# Patient Record
Sex: Male | Born: 1975 | Hispanic: Yes | Marital: Single | Smoking: Never smoker
Health system: Southern US, Community
[De-identification: ages and names within clinical notes are randomized; demographics above are authoritative.]

---

## 2020-05-22 ENCOUNTER — Inpatient Hospital Stay (HOSPITAL_COMMUNITY)
Admission: EM | Admit: 2020-05-22 | Discharge: 2020-06-02 | DRG: 988 | Disposition: A | Discharge status: Home / Self Care | Payer: Self-pay | Attending: General Surgery | Admitting: General Surgery

## 2020-05-22 ENCOUNTER — Emergency Department (HOSPITAL_COMMUNITY): Payer: Self-pay

## 2020-05-22 DIAGNOSIS — Y908 Blood alcohol level of 240 mg/100 ml or more: Secondary | ICD-10-CM | POA: Diagnosis present

## 2020-05-22 DIAGNOSIS — S50311A Abrasion of right elbow, initial encounter: Secondary | ICD-10-CM | POA: Diagnosis present

## 2020-05-22 DIAGNOSIS — S7012XA Contusion of left thigh, initial encounter: Secondary | ICD-10-CM | POA: Diagnosis present

## 2020-05-22 DIAGNOSIS — Z20822 Contact with and (suspected) exposure to covid-19: Secondary | ICD-10-CM | POA: Diagnosis present

## 2020-05-22 DIAGNOSIS — T1490XA Injury, unspecified, initial encounter: Secondary | ICD-10-CM

## 2020-05-22 DIAGNOSIS — S0181XA Laceration without foreign body of other part of head, initial encounter: Secondary | ICD-10-CM | POA: Diagnosis present

## 2020-05-22 DIAGNOSIS — F10220 Alcohol dependence with intoxication, uncomplicated: Secondary | ICD-10-CM | POA: Diagnosis present

## 2020-05-22 DIAGNOSIS — S0001XA Abrasion of scalp, initial encounter: Secondary | ICD-10-CM | POA: Diagnosis present

## 2020-05-22 DIAGNOSIS — S40211A Abrasion of right shoulder, initial encounter: Secondary | ICD-10-CM | POA: Diagnosis present

## 2020-05-22 DIAGNOSIS — Y92410 Unspecified street and highway as the place of occurrence of the external cause: Secondary | ICD-10-CM

## 2020-05-22 DIAGNOSIS — S72432A Displaced fracture of medial condyle of left femur, initial encounter for closed fracture: Principal | ICD-10-CM | POA: Diagnosis present

## 2020-05-22 DIAGNOSIS — Z23 Encounter for immunization: Secondary | ICD-10-CM

## 2020-05-22 DIAGNOSIS — S32029A Unspecified fracture of second lumbar vertebra, initial encounter for closed fracture: Secondary | ICD-10-CM | POA: Diagnosis present

## 2020-05-22 DIAGNOSIS — S81812A Laceration without foreign body, left lower leg, initial encounter: Secondary | ICD-10-CM | POA: Diagnosis present

## 2020-05-22 DIAGNOSIS — S32592A Other specified fracture of left pubis, initial encounter for closed fracture: Secondary | ICD-10-CM | POA: Diagnosis present

## 2020-05-22 DIAGNOSIS — F10230 Alcohol dependence with withdrawal, uncomplicated: Secondary | ICD-10-CM | POA: Diagnosis not present

## 2020-05-22 LAB — LACTIC ACID, PLASMA: Lactic Acid, Venous: 3.6 mmol/L (ref 0.5–1.9)

## 2020-05-22 LAB — CBC
HCT: 40.3 % (ref 39.0–52.0)
Hemoglobin: 13.1 g/dL (ref 13.0–17.0)
MCH: 33.1 pg (ref 26.0–34.0)
MCHC: 32.5 g/dL (ref 30.0–36.0)
MCV: 101.8 fL — ABNORMAL HIGH (ref 80.0–100.0)
Platelets: 186 10*3/uL (ref 150–400)
RBC: 3.96 MIL/uL — ABNORMAL LOW (ref 4.22–5.81)
RDW: 12.5 % (ref 11.5–15.5)
WBC: 6.7 10*3/uL (ref 4.0–10.5)
nRBC: 0 % (ref 0.0–0.2)

## 2020-05-22 LAB — PROTIME-INR
INR: 0.9 (ref 0.8–1.2)
Prothrombin Time: 12.2 seconds (ref 11.4–15.2)

## 2020-05-22 LAB — ETHANOL: Alcohol, Ethyl (B): 371 mg/dL (ref <10)

## 2020-05-22 LAB — SAMPLE TO BLOOD BANK

## 2020-05-22 LAB — SARS CORONAVIRUS 2 BY RT PCR (HOSPITAL ORDER, PERFORMED IN ~~LOC~~ HOSPITAL LAB): SARS Coronavirus 2: NEGATIVE

## 2020-05-22 MED ORDER — SODIUM CHLORIDE 0.9 % IV SOLN
INTRAVENOUS | Status: AC | PRN
  Administered 2020-05-22: 1000 mL via INTRAVENOUS

## 2020-05-22 MED ORDER — IOHEXOL 300 MG/ML  SOLN
100.0000 mL | Freq: Once | INTRAMUSCULAR | Status: AC | PRN
  Administered 2020-05-22: 100 mL via INTRAVENOUS

## 2020-05-22 MED ORDER — TETANUS-DIPHTH-ACELL PERTUSSIS 5-2.5-18.5 LF-MCG/0.5 IM SUSP
0.5000 mL | Freq: Once | INTRAMUSCULAR | Status: AC
  Administered 2020-05-23: 0.5 mL via INTRAMUSCULAR
  Filled 2020-05-22: qty 0.5
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
## 2020-05-22 NOTE — ED Provider Notes (Addendum)
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 back and occiput.  Imaging most notable for left superior and inferior pubic rami fractures as well as right L2 transverse process fracture.  XR of the R knee notable for possible femur fx and rads recommended CT to further eval.  Trauma surgery consulted for admission.  Pt is currently awaiting admission to the trauma service and results of CT femur at time of signout.  Pt signed out to oncoming provider at 0100 in stable condition. Final Clinical Impression(s) / ED Diagnoses Final diagnoses:  Trauma    Rx / DC Orders ED Discharge Orders    None       Rickey PrimusWeekley, Deyonte Cadden, MD 05/23/20 1315    Rickey PrimusWeekley, Kaleya Douse, MD 05/23/20 1317    Little, Ambrose Finlandachel Morgan, MD 05/24/20 [phone]

## 2020-05-23 ENCOUNTER — Emergency Department (HOSPITAL_COMMUNITY): Payer: Self-pay

## 2020-05-23 ENCOUNTER — Observation Stay (HOSPITAL_COMMUNITY): Payer: Self-pay

## 2020-05-23 LAB — CBC
HCT: 30.8 % — ABNORMAL LOW (ref 39.0–52.0)
HCT: 29.9 % — ABNORMAL LOW (ref 39.0–52.0)
Hemoglobin: 10.2 g/dL — ABNORMAL LOW (ref 13.0–17.0)
Hemoglobin: 10.1 g/dL — ABNORMAL LOW (ref 13.0–17.0)
MCH: 34 pg (ref 26.0–34.0)
MCH: 34.2 pg — ABNORMAL HIGH (ref 26.0–34.0)
MCHC: 33.1 g/dL (ref 30.0–36.0)
MCHC: 33.8 g/dL (ref 30.0–36.0)
MCV: 102.7 fL — ABNORMAL HIGH (ref 80.0–100.0)
MCV: 101.4 fL — ABNORMAL HIGH (ref 80.0–100.0)
Platelets: 136 10*3/uL — ABNORMAL LOW (ref 150–400)
Platelets: 145 10*3/uL — ABNORMAL LOW (ref 150–400)
RBC: 3 MIL/uL — ABNORMAL LOW (ref 4.22–5.81)
RBC: 2.95 MIL/uL — ABNORMAL LOW (ref 4.22–5.81)
RDW: 12.6 % (ref 11.5–15.5)
RDW: 12.6 % (ref 11.5–15.5)
WBC: 7.6 10*3/uL (ref 4.0–10.5)
WBC: 7.3 10*3/uL (ref 4.0–10.5)
nRBC: 0 % (ref 0.0–0.2)
nRBC: 0 % (ref 0.0–0.2)

## 2020-05-23 LAB — PHOSPHORUS: Phosphorus: 2.8 mg/dL (ref 2.5–4.6)

## 2020-05-23 LAB — COMPREHENSIVE METABOLIC PANEL
ALT: 83 U/L — ABNORMAL HIGH (ref 0–44)
ALT: 68 U/L — ABNORMAL HIGH (ref 0–44)
AST: 211 U/L — ABNORMAL HIGH (ref 15–41)
AST: 182 U/L — ABNORMAL HIGH (ref 15–41)
Albumin: 4 g/dL (ref 3.5–5.0)
Albumin: 3.4 g/dL — ABNORMAL LOW (ref 3.5–5.0)
Alkaline Phosphatase: 61 U/L (ref 38–126)
Alkaline Phosphatase: 50 U/L (ref 38–126)
Anion gap: 14 (ref 5–15)
Anion gap: 16 — ABNORMAL HIGH (ref 5–15)
BUN: 6 mg/dL (ref 6–20)
BUN: 6 mg/dL (ref 6–20)
CO2: 21 mmol/L — ABNORMAL LOW (ref 22–32)
CO2: 21 mmol/L — ABNORMAL LOW (ref 22–32)
Calcium: 8.3 mg/dL — ABNORMAL LOW (ref 8.9–10.3)
Calcium: 7.8 mg/dL — ABNORMAL LOW (ref 8.9–10.3)
Chloride: 101 mmol/L (ref 98–111)
Chloride: 101 mmol/L (ref 98–111)
Creatinine, Ser: 0.75 mg/dL (ref 0.61–1.24)
Creatinine, Ser: 0.53 mg/dL — ABNORMAL LOW (ref 0.61–1.24)
GFR calc Af Amer: >60 mL/min (ref >60)
GFR calc Af Amer: >60 mL/min (ref >60)
GFR calc non Af Amer: 55 mL/min — ABNORMAL LOW (ref >60)
GFR calc non Af Amer: >60 mL/min (ref >60)
Glucose, Bld: 123 mg/dL — ABNORMAL HIGH (ref 70–99)
Glucose, Bld: 145 mg/dL — ABNORMAL HIGH (ref 70–99)
Potassium: 3.3 mmol/L — ABNORMAL LOW (ref 3.5–5.1)
Potassium: 3.5 mmol/L (ref 3.5–5.1)
Sodium: 136 mmol/L (ref 135–145)
Sodium: 138 mmol/L (ref 135–145)
Total Bilirubin: 0.4 mg/dL (ref 0.3–1.2)
Total Bilirubin: 1.2 mg/dL (ref 0.3–1.2)
Total Protein: 7.1 g/dL (ref 6.5–8.1)
Total Protein: 6.4 g/dL — ABNORMAL LOW (ref 6.5–8.1)

## 2020-05-23 LAB — URINALYSIS, ROUTINE W REFLEX MICROSCOPIC
Bacteria, UA: NONE SEEN
Bilirubin Urine: NEGATIVE
Glucose, UA: NEGATIVE mg/dL
Ketones, ur: 5 mg/dL — AB
Leukocytes,Ua: NEGATIVE
Nitrite: NEGATIVE
Protein, ur: NEGATIVE mg/dL
Specific Gravity, Urine: 1.018 (ref 1.005–1.030)
pH: 5 (ref 5.0–8.0)

## 2020-05-23 LAB — BASIC METABOLIC PANEL
Anion gap: 12 (ref 5–15)
BUN: 6 mg/dL (ref 6–20)
CO2: 20 mmol/L — ABNORMAL LOW (ref 22–32)
Calcium: 7.3 mg/dL — ABNORMAL LOW (ref 8.9–10.3)
Chloride: 107 mmol/L (ref 98–111)
Creatinine, Ser: 0.57 mg/dL — ABNORMAL LOW (ref 0.61–1.24)
GFR calc Af Amer: >60 mL/min (ref >60)
GFR calc non Af Amer: >60 mL/min (ref >60)
Glucose, Bld: 95 mg/dL (ref 70–99)
Potassium: 3.4 mmol/L — ABNORMAL LOW (ref 3.5–5.1)
Sodium: 139 mmol/L (ref 135–145)

## 2020-05-23 LAB — I-STAT CHEM 8, ED
BUN: 5 mg/dL — ABNORMAL LOW (ref 6–20)
Calcium, Ion: 0.94 mmol/L — ABNORMAL LOW (ref 1.15–1.40)
Chloride: 100 mmol/L (ref 98–111)
Creatinine, Ser: 1.1 mg/dL (ref 0.61–1.24)
Glucose, Bld: 117 mg/dL — ABNORMAL HIGH (ref 70–99)
HCT: 42 % (ref 39.0–52.0)
Hemoglobin: 14.3 g/dL (ref 13.0–17.0)
Potassium: 3.3 mmol/L — ABNORMAL LOW (ref 3.5–5.1)
Sodium: 138 mmol/L (ref 135–145)
TCO2: 21 mmol/L — ABNORMAL LOW (ref 22–32)

## 2020-05-23 LAB — MAGNESIUM: Magnesium: 1.6 mg/dL — ABNORMAL LOW (ref 1.7–2.4)

## 2020-05-23 MED ORDER — OXYCODONE HCL 5 MG PO TABS
10.0000 mg | ORAL_TABLET | ORAL | Status: DC | PRN
  Administered 2020-05-23 – 2020-06-02 (×13): 10 mg via ORAL
  Filled 2020-05-23 (×13): qty 2

## 2020-05-23 MED ORDER — THIAMINE HCL 100 MG/ML IJ SOLN: 100.0000 mg | Freq: Every day | INTRAMUSCULAR | Status: DC

## 2020-05-23 MED ORDER — LIDOCAINE-EPINEPHRINE 1 %-1:100000 IJ SOLN
INTRAMUSCULAR | Status: AC
  Filled 2020-05-23: qty 1

## 2020-05-23 MED ORDER — LIDOCAINE-EPINEPHRINE-TETRACAINE (LET) TOPICAL GEL
3.0000 mL | Freq: Once | TOPICAL | Status: AC
  Administered 2020-05-23: 3 mL via TOPICAL
  Filled 2020-05-23: qty 3

## 2020-05-23 MED ORDER — THIAMINE HCL 100 MG PO TABS
100.0000 mg | ORAL_TABLET | Freq: Every day | ORAL | Status: DC
  Administered 2020-05-23 – 2020-05-24 (×2): 100 mg via ORAL
  Filled 2020-05-23 (×2): qty 1

## 2020-05-23 MED ORDER — FOLIC ACID 1 MG PO TABS
1.0000 mg | ORAL_TABLET | Freq: Every day | ORAL | Status: DC
  Administered 2020-05-23 – 2020-05-24 (×2): 1 mg via ORAL
  Filled 2020-05-23 (×2): qty 1

## 2020-05-23 MED ORDER — MORPHINE SULFATE (PF) 2 MG/ML IV SOLN
2.0000 mg | INTRAVENOUS | Status: DC | PRN
  Administered 2020-05-24 – 2020-05-29 (×5): 2 mg via INTRAVENOUS
  Filled 2020-05-23 (×6): qty 1

## 2020-05-23 MED ORDER — ADULT MULTIVITAMIN W/MINERALS CH
1.0000 | ORAL_TABLET | Freq: Every day | ORAL | Status: DC
  Administered 2020-05-23 – 2020-05-24 (×2): 1 via ORAL
  Filled 2020-05-23 (×2): qty 1

## 2020-05-23 MED ORDER — ONDANSETRON HCL 4 MG/2ML IJ SOLN: 4.0000 mg | Freq: Four times a day (QID) | INTRAMUSCULAR | Status: DC | PRN

## 2020-05-23 MED ORDER — ONDANSETRON 4 MG PO TBDP: 4.0000 mg | ORAL_TABLET | Freq: Four times a day (QID) | ORAL | Status: DC | PRN

## 2020-05-23 MED ORDER — DEXTROSE-NACL 5-0.9 % IV SOLN: INTRAVENOUS | Status: DC

## 2020-05-23 MED ORDER — LORAZEPAM 2 MG/ML IJ SOLN
0.0000 mg | Freq: Two times a day (BID) | INTRAMUSCULAR | Status: DC
  Filled 2020-05-23: qty 2

## 2020-05-23 MED ORDER — ENOXAPARIN SODIUM 40 MG/0.4ML ~~LOC~~ SOLN
40.0000 mg | Freq: Every day | SUBCUTANEOUS | Status: DC
  Administered 2020-05-23 – 2020-06-02 (×11): 40 mg via SUBCUTANEOUS
  Filled 2020-05-23 (×12): qty 0.4

## 2020-05-23 MED ORDER — LORAZEPAM 2 MG/ML IJ SOLN
1.0000 mg | INTRAMUSCULAR | Status: DC | PRN
  Administered 2020-05-24: 4 mg via INTRAVENOUS
  Administered 2020-05-24 (×3): 2 mg via INTRAVENOUS
  Filled 2020-05-23: qty 1
  Filled 2020-05-23: qty 2

## 2020-05-23 MED ORDER — LORAZEPAM 2 MG/ML IJ SOLN
0.0000 mg | Freq: Four times a day (QID) | INTRAMUSCULAR | Status: DC
  Administered 2020-05-23 – 2020-05-24 (×3): 1 mg via INTRAVENOUS
  Administered 2020-05-24: 4 mg via INTRAVENOUS
  Filled 2020-05-23 (×3): qty 1
  Filled 2020-05-23: qty 2

## 2020-05-23 MED ORDER — LORAZEPAM 1 MG PO TABS: 1.0000 mg | ORAL_TABLET | ORAL | Status: DC | PRN

## 2020-05-23 MED ORDER — LIDOCAINE HCL (PF) 1 % IJ SOLN
10.0000 mL | Freq: Once | INTRAMUSCULAR | Status: AC
  Administered 2020-05-23: 10 mL

## 2020-05-23 NOTE — ED Provider Notes (Signed)
..  Laceration Repair  Date/Time: 05/23/2020 3:00 AM Performed by: Cherly Anderson, PA-C Authorized by: Cherly Anderson, PA-C   Consent:    Consent obtained:  Verbal   Consent given by:  Patient   Risks discussed:  Infection, need for additional repair, nerve damage, poor wound healing, poor cosmetic result, pain, tendon damage, retained foreign body and vascular damage   Alternatives discussed:  No treatment Anesthesia (see MAR for exact dosages):    Anesthesia method:  Topical application and local infiltration   Topical anesthetic:  LET   Local anesthetic:  Lidocaine 1% w/o epi Laceration details:    Location:  Leg   Leg location:  L lower leg   Length (cm):  7   Depth (mm):  4 Repair type:    Repair type:  Intermediate Pre-procedure details:    Preparation:  Patient was prepped and draped in usual sterile fashion and imaging obtained to evaluate for foreign bodies Exploration:    Hemostasis achieved with:  Direct pressure   Wound exploration: wound explored through full range of motion and entire depth of wound probed and visualized     Contaminated: no   Treatment:    Area cleansed with:  Betadine   Amount of cleaning:  Standard   Irrigation solution:  Sterile water   Irrigation volume:  1L   Irrigation method:  Pressure wash Fascia repair:    Suture size:  4-0   Suture material:  Vicryl   Suture technique:  Simple interrupted   Number of sutures:  3 Skin repair:    Repair method:  Sutures   Suture size:  4-0   Suture material:  Nylon   Suture technique:  Simple interrupted   Number of sutures:  8 Approximation:    Approximation:  Close Post-procedure details:    Dressing:  Antibiotic ointment   Patient tolerance of procedure:  Tolerated well, no immediate complications  Please see Dr. Cora Daniels & Dr. Fredirick Maudlin documentation for full H&P. Laceration performed by me @ primary ED team's request per procedure note above.     Cherly Anderson,  PA-C 05/23/20 0302    Little, Ambrose Finland, MD 05/23/20 1014

## 2020-05-23 NOTE — ED Notes (Signed)
[facility] adviser at  The bedside

## 2020-05-23 NOTE — ED Notes (Signed)
The pt lupped of his c-collar one hour ago and he refused to put it back pn

## 2020-05-23 NOTE — ED Notes (Signed)
MD paged about CIWA of 5, no new orders received yet

## 2020-05-23 NOTE — Progress Notes (Signed)
Orthopedic Tech Progress Note Patient Details:  David Bishop 1975/10/29 [id number] Called in order to [facility]. Patient ID: David Bishop, male   DOB: 02/23/1976, 44 y.o.   MRN: [id number]   [name] 05/23/2020, 12:46 PM

## 2020-05-23 NOTE — ED Notes (Signed)
Pt transported to MRI 
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
## 2020-05-23 NOTE — Progress Notes (Signed)
Orthopedic Tech Progress Note Patient Details:  David Bishop August 27, 1976 [id number]  Ortho Devices Type of Ortho Device: Knee Immobilizer Ortho Device/Splint Location: lle Ortho Device/Splint Interventions: Ordered, Application, Adjustment   Post Interventions Patient Tolerated: Well Instructions Provided: Care of device, Adjustment of device   [name] 05/23/2020, 6:24 AM

## 2020-05-23 NOTE — ED Notes (Signed)
The pts bp has lowered since  The last bag of  fluiid infused  nss hung to run wo per dr Rubin Payor

## 2020-05-23 NOTE — ED Notes (Signed)
Pt back to c-r sleeping unless disturbed
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
## 2020-05-23 NOTE — ED Notes (Signed)
Sleeping still 
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
## 2020-05-24 LAB — MRSA PCR SCREENING: MRSA by PCR: NEGATIVE

## 2020-05-24 MED ORDER — LORAZEPAM 2 MG/ML IJ SOLN
1.0000 mg | INTRAMUSCULAR | Status: DC | PRN
  Administered 2020-05-24 – 2020-05-29 (×24): 2 mg via INTRAVENOUS
  Filled 2020-05-24 (×25): qty 1

## 2020-05-24 MED ORDER — CHLORDIAZEPOXIDE HCL 25 MG PO CAPS
25.0000 mg | ORAL_CAPSULE | Freq: Three times a day (TID) | ORAL | Status: DC
  Administered 2020-05-24 – 2020-05-25 (×6): 25 mg via ORAL
  Filled 2020-05-24 (×6): qty 1

## 2020-05-24 MED ORDER — METOPROLOL TARTRATE 5 MG/5ML IV SOLN
2.5000 mg | Freq: Four times a day (QID) | INTRAVENOUS | Status: DC
  Administered 2020-05-26 – 2020-05-27 (×2): 2.5 mg via INTRAVENOUS
  Filled 2020-05-24 (×3): qty 5

## 2020-05-24 MED ORDER — SPIRITUS FRUMENTI
1.0000 | Freq: Two times a day (BID) | ORAL | Status: DC
  Administered 2020-05-24 – 2020-05-26 (×4): 1 via ORAL
  Filled 2020-05-24 (×5): qty 1

## 2020-05-24 MED ORDER — MIDAZOLAM HCL 2 MG/2ML IJ SOLN
4.0000 mg | Freq: Once | INTRAMUSCULAR | Status: AC
  Administered 2020-05-24: 4 mg via INTRAVENOUS

## 2020-05-24 MED ORDER — ADULT MULTIVITAMIN W/MINERALS CH
1.0000 | ORAL_TABLET | Freq: Every day | ORAL | Status: DC
  Administered 2020-05-24 – 2020-06-02 (×10): 1 via ORAL
  Filled 2020-05-24 (×10): qty 1

## 2020-05-24 MED ORDER — FOLIC ACID 1 MG PO TABS
1.0000 mg | ORAL_TABLET | Freq: Every day | ORAL | Status: DC
  Administered 2020-05-24 – 2020-06-02 (×10): 1 mg via ORAL
  Filled 2020-05-24 (×9): qty 1

## 2020-05-24 MED ORDER — THIAMINE HCL 100 MG PO TABS
100.0000 mg | ORAL_TABLET | Freq: Every day | ORAL | Status: DC
  Administered 2020-05-24 – 2020-06-02 (×10): 100 mg via ORAL
  Filled 2020-05-24 (×10): qty 1

## 2020-05-24 MED ORDER — CHLORHEXIDINE GLUCONATE CLOTH 2 % EX PADS
6.0000 | MEDICATED_PAD | Freq: Every day | CUTANEOUS | Status: DC
  Administered 2020-05-24 – 2020-06-02 (×8): 6 via TOPICAL

## 2020-05-24 MED ORDER — MIDAZOLAM HCL 2 MG/2ML IJ SOLN
INTRAMUSCULAR | Status: AC
  Filled 2020-05-24: qty 4

## 2020-05-24 MED ORDER — DEXMEDETOMIDINE HCL IN NACL 400 MCG/100ML IV SOLN
0.2000 ug/kg/h | INTRAVENOUS | Status: DC
  Administered 2020-05-24: 0.4 ug/kg/h via INTRAVENOUS
  Administered 2020-05-24 – 2020-05-25 (×2): 0.7 ug/kg/h via INTRAVENOUS
  Administered 2020-05-25 (×2): 1 ug/kg/h via INTRAVENOUS
  Administered 2020-05-26: 0.6 ug/kg/h via INTRAVENOUS
  Administered 2020-05-26: 0.8 ug/kg/h via INTRAVENOUS
  Administered 2020-05-27: 0.6 ug/kg/h via INTRAVENOUS
  Filled 2020-05-24 (×9): qty 100
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
## 2020-05-24 NOTE — Progress Notes (Signed)
Report called to [facility] [address] ICU. Orest Dikes notified of transfer.

## 2020-05-24 NOTE — Progress Notes (Signed)
I spoke with patient's friend Karrie Meres [phone]. Updated her on patient's clinical condition. She reports patient typically drinks between 4-6 40oz beers daily. She reports that he no longer lives with her but she has known him about 10 years. She reports that we can keep her updated and that she will be the one to pick him up when he is ready to leave the hospital.  Juliet Rude , [facility] 05/24/2020, 9:05 AM Please see Amion for pager number during day hours 7:00am-4:30pm

## 2020-05-24 NOTE — Progress Notes (Signed)
PT Cancellation Note  Patient Details Name: David Bishop MRN: [id number] DOB: 1976-06-20   Cancelled Treatment:    Reason Eval/Treat Not Completed: Medical issues which prohibited therapy. Per the pt's RN, the pt has required multiple doses of ativan due to acute alcohol withdrawal and DTs this morning and the RN asked PT to return later in the day when the pt may be more stable. PT will continue to follow and treat as time/schedule allow to facilitate improved movement and safety prior to d/c.  Rolm Baptise, PT, DPT   Acute Rehabilitation Department Pager #: [phone]   Gaetana Michaelis 05/24/2020, 9:12 AM

## 2020-05-24 NOTE — Significant Event (Addendum)
Rapid Response Event Note   Reason for Call :  ETOH withdrawals.  Initial Focused Assessment:  Pt agitated, sweating. Tremors. Not following commands, disoriented. Pt chewing out of wrist restraints and continuously attempting to get out of bed. Total of 10 mg IV Ativan given since 0700 today. Pt admitted yesterday. CIWA 29-31 now. Pt spanish speaking and not following safety instruction from staff or telesitter.   Interventions:  Provider notified. Orders received for 3-point restraints. Transfer to ICU.   Plan of Care:  Transfer to ICU  Event Summary:  MD Notified: Adin Hector, PA Call Time: 1109 Arrival Time: 1115 End Time: 1210- Rapid Response RN called away to another emergency  Jennye Moccasin, RN

## 2020-05-24 NOTE — Progress Notes (Signed)
Pt became very agitated regarding his belongings. Wanted his bag of stuff. [name] was translating during very agitated phase. We gave patient his bag and was in distress about his money.  When counted it was $391.26. Patient told [name] he had $460.  3 nursed verified it was $391.26. Patient calmed down quite a bit after money was put in his gown pocket.  Will continue to monitor. [name] 1:31 PM

## 2020-05-25 MED ORDER — HALOPERIDOL LACTATE 5 MG/ML IJ SOLN
INTRAMUSCULAR | Status: AC
  Administered 2020-05-25: 5 mg via INTRAVENOUS
  Filled 2020-05-25: qty 1

## 2020-05-25 MED ORDER — HALOPERIDOL LACTATE 5 MG/ML IJ SOLN
5.0000 mg | INTRAMUSCULAR | Status: DC | PRN
  Administered 2020-05-26 – 2020-05-28 (×5): 5 mg via INTRAVENOUS
  Filled 2020-05-25 (×5): qty 1

## 2020-05-25 NOTE — Progress Notes (Signed)
There is no urine oupPatient can't void at this time. Bladder scan done and greater than 999 ml in bladder noted. Straight cath performed and 1200 ml of clean urine out. Condom catheter replaced. Patient is calm and sleeping at this time. Will continue to assess.   Dr. Dwain Sarna notified- bladder scan every 6 hours order received. Please, call MD prior to straight cath if pt is urinary retention again.
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
## 2020-05-25 NOTE — Progress Notes (Signed)
Paged Dr. Janee Morn regarding continued agitation despite medication and patient able to get out of bilateral wrist and posey waist belt restraint x 2. Verbal order for ankle restraint and see MAR for medication changes.

## 2020-05-26 LAB — BASIC METABOLIC PANEL
Anion gap: 11 (ref 5–15)
BUN: 7 mg/dL (ref 6–20)
CO2: 25 mmol/L (ref 22–32)
Calcium: 8.4 mg/dL — ABNORMAL LOW (ref 8.9–10.3)
Chloride: 100 mmol/L (ref 98–111)
Creatinine, Ser: 0.53 mg/dL — ABNORMAL LOW (ref 0.61–1.24)
GFR calc Af Amer: >60 mL/min (ref >60)
GFR calc non Af Amer: >60 mL/min (ref >60)
Glucose, Bld: 112 mg/dL — ABNORMAL HIGH (ref 70–99)
Potassium: 2.9 mmol/L — ABNORMAL LOW (ref 3.5–5.1)
Sodium: 136 mmol/L (ref 135–145)

## 2020-05-26 MED ORDER — SPIRITUS FRUMENTI
3.0000 | Freq: Three times a day (TID) | ORAL | Status: DC
  Administered 2020-05-26: 1 via ORAL
  Administered 2020-05-26: 2 via ORAL
  Administered 2020-05-27 (×6): 1 via ORAL
  Administered 2020-05-28 (×3): 3 via ORAL
  Filled 2020-05-26 (×13): qty 3

## 2020-05-26 MED ORDER — MAGNESIUM SULFATE 4 GM/100ML IV SOLN
4.0000 g | Freq: Once | INTRAVENOUS | Status: AC
  Administered 2020-05-26: 4 g via INTRAVENOUS
  Filled 2020-05-26: qty 100

## 2020-05-26 MED ORDER — POTASSIUM CHLORIDE 20 MEQ/15ML (10%) PO SOLN
40.0000 meq | ORAL | Status: AC
  Administered 2020-05-26 (×3): 40 meq via ORAL
  Filled 2020-05-26 (×3): qty 30

## 2020-05-26 MED ORDER — SODIUM CHLORIDE 0.9 % IV SOLN: INTRAVENOUS | Status: DC | PRN

## 2020-05-26 MED ORDER — CHLORDIAZEPOXIDE HCL 25 MG PO CAPS
50.0000 mg | ORAL_CAPSULE | Freq: Three times a day (TID) | ORAL | Status: DC
  Administered 2020-05-26 – 2020-05-30 (×15): 50 mg via ORAL
  Filled 2020-05-26 (×15): qty 2
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
## 2020-05-26 NOTE — TOC CAGE-AID Note (Signed)
Transition of Care [facility]) - CAGE-AID Screening   Patient Details  Name: David Bishop MRN: [id number] Date of Birth: 1976/06/08  Transition of Care [facility]) CM/SW Contact:    Jimmy Picket, LCSWA Phone Number: 05/26/2020, 2:41 PM   Clinical Narrative:  Pt was unable to participate in assessment due to being oriented to self. Please reconsult when medically ready.   CAGE-AID Screening: Substance Abuse Screening unable to be completed due to: : Patient unable to participate               Isabella Stalling Clinical Social Worker [phone]

## 2020-05-27 LAB — BASIC METABOLIC PANEL
Anion gap: 10 (ref 5–15)
BUN: <5 mg/dL — ABNORMAL LOW (ref 6–20)
CO2: 22 mmol/L (ref 22–32)
Calcium: 8.6 mg/dL — ABNORMAL LOW (ref 8.9–10.3)
Chloride: 103 mmol/L (ref 98–111)
Creatinine, Ser: 0.41 mg/dL — ABNORMAL LOW (ref 0.61–1.24)
GFR calc Af Amer: >60 mL/min (ref >60)
GFR calc non Af Amer: >60 mL/min (ref >60)
Glucose, Bld: 93 mg/dL (ref 70–99)
Potassium: 3.7 mmol/L (ref 3.5–5.1)
Sodium: 135 mmol/L (ref 135–145)

## 2020-05-27 LAB — MAGNESIUM: Magnesium: 1.8 mg/dL (ref 1.7–2.4)

## 2020-05-27 LAB — PHOSPHORUS: Phosphorus: 3.3 mg/dL (ref 2.5–4.6)

## 2020-05-27 MED ORDER — METOPROLOL TARTRATE 5 MG/5ML IV SOLN
5.0000 mg | Freq: Once | INTRAVENOUS | Status: AC
  Administered 2020-05-27: 5 mg via INTRAVENOUS
  Filled 2020-05-27: qty 5

## 2020-05-27 MED ORDER — METOPROLOL TARTRATE 25 MG PO TABS
25.0000 mg | ORAL_TABLET | Freq: Two times a day (BID) | ORAL | Status: DC
  Administered 2020-05-27 – 2020-06-01 (×9): 25 mg via ORAL
  Filled 2020-05-27 (×12): qty 1

## 2020-05-27 MED ORDER — METOPROLOL TARTRATE 5 MG/5ML IV SOLN: 5.0000 mg | Freq: Four times a day (QID) | INTRAVENOUS | Status: DC | PRN
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
## 2020-05-27 NOTE — Progress Notes (Signed)
Pt refusing to let staff put his personal belongings in a safe place.  Insisting on keeping his phone, key, and money in his sock and leg immobilizer.
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
## 2020-05-28 LAB — BASIC METABOLIC PANEL
Anion gap: 11 (ref 5–15)
BUN: 7 mg/dL (ref 6–20)
CO2: 26 mmol/L (ref 22–32)
Calcium: 8.8 mg/dL — ABNORMAL LOW (ref 8.9–10.3)
Chloride: 97 mmol/L — ABNORMAL LOW (ref 98–111)
Creatinine, Ser: 0.47 mg/dL — ABNORMAL LOW (ref 0.61–1.24)
GFR calc Af Amer: >60 mL/min (ref >60)
GFR calc non Af Amer: >60 mL/min (ref >60)
Glucose, Bld: 115 mg/dL — ABNORMAL HIGH (ref 70–99)
Potassium: 3.3 mmol/L — ABNORMAL LOW (ref 3.5–5.1)
Sodium: 134 mmol/L — ABNORMAL LOW (ref 135–145)

## 2020-05-28 LAB — PHOSPHORUS: Phosphorus: 3.5 mg/dL (ref 2.5–4.6)

## 2020-05-28 LAB — MAGNESIUM: Magnesium: 1.9 mg/dL (ref 1.7–2.4)

## 2020-05-28 NOTE — TOC Progression Note (Signed)
Transition of Care [facility]) - Progression Note    Patient Details  Name: David Bishop MRN: [id number] Date of Birth: 08-09-1976  [facility] of [facility]) CM/SW Contact  Cleda Clarks, [place] Phone Number: 05/28/2020, 5:42 PM  Clinical Narrative:    PT Recommendation updated to SNF. Patient currently oriented x1. Per previous RNCM note, patient resides with roommates who are unable to care for him at discharge. TOC team will follow-up with patient once oriented and continue to assist with discharge planning.  Expected Discharge Plan: Home/Self Care Barriers to Discharge: Continued Medical Work up  Expected Discharge Plan and Services Expected Discharge Plan: Home/Self Care   Discharge Planning Services: CM Consult   Living arrangements for the past 2 months: Boarding House                                       Social Determinants of Health (SDOH) Interventions    Readmission Risk Interventions No flowsheet data found.
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
## 2020-05-28 NOTE — Progress Notes (Addendum)
Patient transferred to room # 13 without issues. Belongings taken as well as 2 orthodic devices and a clear bag that has some screws / orthodic pieces in it. Interpreter monitor also in room.   2343- Patient leads noted to be off on monitor. Patient alert and sitting on side of bed scratching his right leg. Patient out of restraints. Restraints still tied to the bed. Patient doesn't appear to be in any distress. Vitals stable. Patient was able to stand and walk to the head of the bed. Explanation given for reason of restraints. Patient denies pain. Offered water, declined. Patient says he wants a beer. Offered snack. Patient declined. Uses urinal. Soft wrist restraints placed back on patient along with posey belt. Will continue to observe. Leads replaced

## 2020-05-28 NOTE — Progress Notes (Signed)
Patient with money ($389 cash, $2.51 change) in a plastic bag in his bed. RN Tammy took money to security with pt's permission. Ohm Dentler, [place] Scrape, RN

## 2020-05-29 LAB — BASIC METABOLIC PANEL
Anion gap: 13 (ref 5–15)
BUN: 8 mg/dL (ref 6–20)
CO2: 25 mmol/L (ref 22–32)
Calcium: 9.2 mg/dL (ref 8.9–10.3)
Chloride: 97 mmol/L — ABNORMAL LOW (ref 98–111)
Creatinine, Ser: 0.53 mg/dL — ABNORMAL LOW (ref 0.61–1.24)
GFR calc Af Amer: >60 mL/min (ref >60)
GFR calc non Af Amer: >60 mL/min (ref >60)
Glucose, Bld: 127 mg/dL — ABNORMAL HIGH (ref 70–99)
Potassium: 3.4 mmol/L — ABNORMAL LOW (ref 3.5–5.1)
Sodium: 135 mmol/L (ref 135–145)

## 2020-05-29 LAB — MAGNESIUM: Magnesium: 1.7 mg/dL (ref 1.7–2.4)

## 2020-05-29 LAB — PHOSPHORUS: Phosphorus: 3.9 mg/dL (ref 2.5–4.6)

## 2020-05-29 MED ORDER — SPIRITUS FRUMENTI
2.0000 | Freq: Three times a day (TID) | ORAL | Status: DC
  Administered 2020-05-29 – 2020-05-30 (×5): 2 via ORAL
  Filled 2020-05-29 (×7): qty 2
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
## 2020-05-29 NOTE — Progress Notes (Addendum)
Patient has Recruitment consultant. Is no longer in posey belt restraint or soft wrist restraints at this time.   0055-Patient continue to have sitter at bedside due to noncompliance for safety, attempting to get out of bed. Patient denies pain. Points to bathroom, assisted x 1.

## 2020-05-29 NOTE — Plan of Care (Signed)
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
## 2020-05-31 MED ORDER — CHLORDIAZEPOXIDE HCL 25 MG PO CAPS
25.0000 mg | ORAL_CAPSULE | Freq: Three times a day (TID) | ORAL | Status: DC
  Administered 2020-05-31 – 2020-06-01 (×4): 25 mg via ORAL
  Filled 2020-05-31 (×4): qty 1

## 2020-05-31 MED ORDER — SPIRITUS FRUMENTI
1.0000 | Freq: Three times a day (TID) | ORAL | Status: DC
  Administered 2020-05-31 – 2020-06-01 (×2): 1 via ORAL
  Filled 2020-05-31 (×4): qty 1

## 2020-05-31 NOTE — Progress Notes (Signed)
Orthopedic Tech Progress Note Patient Details:  David Bishop Mar 07, 1976 [id number] Called [facility] to have someone come out and make adjustments to patient ROM KNEE BRACE Patient ID: David Bishop, male   DOB: 25-Feb-1976, 44 y.o.   MRN: [id number]   David Bishop 05/31/2020, 11:07 AM
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
## 2020-06-01 ENCOUNTER — Other Ambulatory Visit: Payer: Self-pay

## 2020-06-01 ENCOUNTER — Encounter (HOSPITAL_COMMUNITY): Payer: Self-pay

## 2020-06-01 MED ORDER — SPIRITUS FRUMENTI
1.0000 | Freq: Two times a day (BID) | ORAL | Status: DC
  Administered 2020-06-01 – 2020-06-02 (×2): 1 via ORAL
  Filled 2020-06-01 (×3): qty 1

## 2020-06-01 MED ORDER — CHLORDIAZEPOXIDE HCL 25 MG PO CAPS
25.0000 mg | ORAL_CAPSULE | Freq: Two times a day (BID) | ORAL | Status: DC
  Administered 2020-06-01 – 2020-06-02 (×2): 25 mg via ORAL
  Filled 2020-06-01 (×3): qty 1
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
## 2020-06-01 NOTE — TOC Progression Note (Signed)
Transition of Care [facility]) - Progression Note    Patient Details  Name: Hansford Hirt MRN: [id number] Date of Birth: 03-Aug-1976  [facility] CM/SW Contact  Glennon Mac, RN Phone Number: 06/01/2020, 4:21 PM  Clinical Narrative: Patient has progressed to being able to discharge home with home health services, but will need 24-hour supervision at discharge.  Attempted to reach patient's friend, Karrie Meres, at [phone], but was only able to leave a voicemail.  We will continue to try to reach patient's friend to see if she will reconsider taking patient home while he recovers.    Expected Discharge Plan: Home/Self Care Barriers to Discharge: Continued Medical Work up  Expected Discharge Plan and Services Expected Discharge Plan: Home/Self Care   Discharge Planning Services: CM Consult   Living arrangements for the past 2 months: Boarding House                                       Social Determinants of Health (SDOH) Interventions    Readmission Risk Interventions No flowsheet data found.  Quintella Baton, RN, BSN  Trauma/Neuro ICU Case Manager [phone]

## 2020-06-02 MED ORDER — OXYCODONE HCL 5 MG PO TABS
5.0000 mg | ORAL_TABLET | ORAL | 0 refills | Status: AC | PRN
Start: 2020-06-02 — End: ?

## 2020-06-02 MED ORDER — ACETAMINOPHEN 500 MG PO TABS: 500.0000 mg | ORAL_TABLET | Freq: Four times a day (QID) | ORAL | Status: AC | PRN

## 2020-06-02 MED ORDER — CHLORDIAZEPOXIDE HCL 25 MG PO CAPS: 25.0000 mg | ORAL_CAPSULE | Freq: Every day | ORAL | 0 refills | Status: AC

## 2020-06-02 MED FILL — CHLORDIAZEPOXIDE 25 MG CAP: 25 | 3 days supply | Qty: 3 | Fill #0

## 2020-06-02 MED FILL — oxyCODONE HCL 5 MG TABS: 5 | 3 days supply | Qty: 20 | Fill #0

## 2020-06-02 NOTE — Progress Notes (Signed)
Orthopedic Tech Progress Note Patient Details:  Leng Montesdeoca Jul 03, 1976 [id number] Was asked by RN to have someone come out and make adjustments to ROM BRACE Patient ID: Raedyn Wenke, male   DOB: 03/27/1976, 44 y.o.   MRN: [id number]   RISHAV ROCKEFELLER 06/02/2020, 11:25 AM
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
## 2020-06-02 NOTE — Progress Notes (Signed)
D/C instructions given and explained using Interpreter. Patient verbalized understanding.  Monetary belonging obtained from Security office and remitted to patient, who acknowledged receipt and accuracy. Left knee brace adjustment made. TOC medication and equipments delivered to room.  Discharged Home via Wheelchair alert, oriented at baseline. Male friend providing ride home. Mikal Plane, BSN, RN
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
## 2020-06-02 NOTE — Discharge Instructions (Signed)
Touch down weight bearing to left leg Cmo usar un inmovilizador de rodilla How to Use a Knee Immobilizer  El inmovilizador de rodilla, tambin llamado rodillera, se [place] para sostener y proteger una rodilla lesionada o que duele. Es posible que tambin deba usarlo despus de [place] de rodilla. La rodillera evita que la rodilla se mueva o se doble mientras se cura. Use la rodillera y [place] solamente segn las indicaciones del mdico. En general, la rodillera:  Debe tener correas, sistemas de cierre o cintas que se ajusten bien alrededor de la pierna.  No debe estar muy ajustada ni muy floja. Siga estas indicaciones en su casa:  Mientras est en reposo, levante (eleve) la pierna por encima del nivel del corazn. Se pueden usar [employer]. Esto reduce el dolor punzante y contribuye a la curacin.  Afloje la rodillera si siente hormigueo en los dedos de los pies o si nota otros sntomas o signos de que est muy ajustada, por [place], los siguientes: ? Hinchazn. ? Entumecimiento. ? Cambio de color de la piel del pie o del tobillo. ? Aumento del dolor.  Mantenga la rodillera limpia.  Si la rodillera no es impermeable: ? No deje que se moje. ? Cbrala con un envoltorio hermtico cuando tome un bao de inmersin o [place].  Si el mdico le dice que puede sacarse (quitarse) la rodillera: ? Qutesela antes de tomar una ducha o un bao. ? Fjese si tiene irritacin en la piel. ? Seque la zona por completo con una toalla antes de volver a colocarse la rodillera. Comunquese con un mdico si:  La rodillera se rompe o debe reemplazarla.  Aumentan la hinchazn o Chief Technology Officer en la rodilla, el pie o el tobillo.  La rodillera no resulta eficaz.  La rodillera empeora el dolor de rodilla. Resumen  El inmovilizador de rodilla, tambin llamado rodillera, evita que la rodilla se [place] o se doble [place] se cura.  Las diferentes rodilleras tendrn distintas instrucciones de  [place]. Siga las indicaciones del mdico.  Llame a su mdico si el dolor o la hinchazn aumentan, si se le rompe la rodillera o si esta no ayuda a Engineer, materials de rodilla. Esta informacin no tiene Theme park manager el consejo del mdico. Asegrese de hacerle al mdico cualquier pregunta que tenga. Document Revised: 05/22/2017 Document Reviewed: 05/22/2017 Elsevier Patient Education  2020 [employer].

## 2020-06-02 NOTE — TOC Transition Note (Signed)
Transition of Care [facility]) - CM/SW Discharge Note   Patient Details  Name: David Bishop MRN: [id number] Date of Birth: [birth year]  Transition of Care [facility]) CM/SW Contact:  Glennon Mac, RN Phone Number: 06/02/2020, 1:30  Clinical Narrative: Patient medically stable for discharge home today; I spoke with his friend [name] and she states she will pick him up to transport him home.  She states she can arrive here at 3:00.  PT/OT recommending home health therapies; referral made for charity home health, though current charity home health agency does not service the area that patient lives in.  Will arrange outpatient physical and occupational therapy; patient's friend [name] states that she will try to assist patient with appointments after discharge.  Patient uninsured, but is eligible for medication assistance through [facility] program.  Discharge prescription sent to [facility] pharmacy to be filled using MATCH letter.  Follow-up appointment made with [facility] and [facility] for PCP follow-up, as patient has no primary care doctor at this time.  Referral to [facility] for recommended DME, to be delivered to bedside prior to discharge.   Final next level of care: OP Rehab Barriers to Discharge: Barriers Resolved                       Discharge Plan and Services   Discharge Planning Services: Indigent Health Clinic, [facility], Medication Assistance            DME Arranged: 3-N-1, Walker rolling, Tub bench   Date DME Agency Contacted: 06/02/20 Time DME Agency Contacted: 1203 Representative spoke with at DME Agency: texted to [facility]            Social Determinants of Health (SDOH) Interventions     Readmission Risk Interventions Readmission Risk Prevention Plan 06/02/2020 06/02/2020  Post Dischage Appt Complete Complete  Medication Screening Complete Complete  Transportation Screening Complete Complete   Quintella Baton, RN, BSN  Trauma/Neuro ICU Case  Manager [phone]

## 2020-06-24 ENCOUNTER — Inpatient Hospital Stay: Payer: Self-pay | Admitting: Physician Assistant
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
